# Patient Record
Sex: Female | Born: 1975 | Race: White | Hispanic: No | Marital: Single | Smoking: Never smoker
Health system: Southern US, Community
[De-identification: ages and names within clinical notes are randomized; demographics above are authoritative.]

---

## 2017-02-27 ENCOUNTER — Ambulatory Visit (INDEPENDENT_AMBULATORY_CARE_PROVIDER_SITE_OTHER): Payer: Worker's Compensation | Admitting: Orthopaedic Surgery

## 2017-02-27 DIAGNOSIS — M79645 Pain in left finger(s): Secondary | ICD-10-CM

## 2017-02-27 MED ORDER — PREDNISONE 10 MG (21) PO TBPK: ORAL_TABLET | ORAL | 0 refills | Status: AC

## 2017-02-27 NOTE — Progress Notes (Signed)
Office Visit Note   Patient: Kaylee Mccoy           Date of Birth: May 09, 1975           MRN: [id number] Visit Date: 02/27/2017              Requested by: No referring provider defined for this encounter. PCP: Cheron SchaumannVelazquez, Gretchen Y., MD   Assessment & Plan: Visit Diagnoses:  1. Finger pain, left     Plan: I reviewed the MRI of her left ring finger which shows edema within the flexor tendon sheath.  There is no evidence of a tear.  Functionally speaking there is no concern of any deficits.  Sounds like she is essentially just been working through the pain during this time and she has not been able to let her finger rest.  Will give her work restrictions next 2 weeks and put her on a prednisone taper.  Recheck in 2 weeks.  We may consider injection if she is not significantly better.  Follow-Up Instructions: Return in about 2 weeks (around 03/13/2017).   Orders:  No orders of the defined types were placed in this encounter.  Meds ordered this encounter  Medications  . predniSONE (STERAPRED UNI-PAK 21 TAB) 10 MG (21) TBPK tablet    Sig: Take as directed    Dispense:  21 tablet    Refill:  0      Procedures: No procedures performed   Clinical Data: No additional findings.   Subjective: Chief Complaint  Patient presents with  . 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  Her x-rays have been normal.  She also had an MRI which show some edema within the flexor tendons.  Denies any numbness and tingling.  Denies any triggering.    Review of Systems  Constitutional: Negative.   HENT: Negative.   Eyes: Negative.   Respiratory: Negative.   Cardiovascular: Negative.   Endocrine: Negative.   Musculoskeletal: Negative.   Neurological: Negative.   Hematological: Negative.   Psychiatric/Behavioral: Negative.   All  other systems reviewed and are negative.    Objective: Vital Signs: There were no vitals taken for this visit.  Physical Exam  Constitutional: She is oriented to person, place, and time. She appears well-developed and well-nourished.  HENT:  Head: Normocephalic and atraumatic.  Eyes: EOM are normal.  Neck: Neck supple.  Pulmonary/Chest: Effort normal.  Abdominal: Soft.  Neurological: She is alert and oriented to person, place, and time.  Skin: Skin is warm. Capillary refill takes less than 2 seconds.  Psychiatric: She has a normal mood and affect. Her behavior is normal. Judgment and thought content normal.  Nursing note and vitals reviewed.   Ortho Exam Left ring finger exam shows normally functioning flexor and extensor tendons.  There is no triggering.  She does have tenderness along the flexor tendon sheath.  There is no significant swelling.  Normal capillary refill. Specialty Comments:  No specialty comments available.  Imaging: No results found.   PMFS History: There are no active problems to display for this patient.  No past medical history on file.  No family history on file.   Social History   Occupational History  . Not on file  Tobacco Use  . Smoking status: Not on file  Substance and Sexual  Activity  . Alcohol use: Not on file  . Drug use: Not on file  . Sexual activity: Not on file

## 2017-03-03 ENCOUNTER — Telehealth (INDEPENDENT_AMBULATORY_CARE_PROVIDER_SITE_OTHER): Payer: Self-pay

## 2017-03-03 NOTE — Telephone Encounter (Signed)
Faxed the 02/27/17 office note and work note to [facility] per her request

## 2017-03-05 ENCOUNTER — Telehealth (INDEPENDENT_AMBULATORY_CARE_PROVIDER_SITE_OTHER): Payer: Self-pay

## 2017-03-05 ENCOUNTER — Other Ambulatory Visit (INDEPENDENT_AMBULATORY_CARE_PROVIDER_SITE_OTHER): Payer: Self-pay

## 2017-03-05 NOTE — Telephone Encounter (Signed)
Physical therapy called and needs an order for left hand ring finger therapy. I faxed this and then also faxed last office dictation to [phone]

## 2017-03-10 ENCOUNTER — Telehealth (INDEPENDENT_AMBULATORY_CARE_PROVIDER_SITE_OTHER): Payer: Self-pay | Admitting: Orthopaedic Surgery

## 2017-03-10 NOTE — Telephone Encounter (Signed)
Patient took her last Prednisone this morning. She has body aches and believes she may be catching a cold. She is worried about drug interaction and wanted to see if it would be okay to take benadryl? Also took her birth control and Murelax. CB #  [phone]

## 2017-03-10 NOTE — Telephone Encounter (Signed)
Wait until she's over her cold before starting the prednisone.  Benadryl is fine to take.

## 2017-03-10 NOTE — Telephone Encounter (Signed)
See message below °

## 2017-03-11 ENCOUNTER — Encounter (INDEPENDENT_AMBULATORY_CARE_PROVIDER_SITE_OTHER): Payer: Self-pay | Admitting: Orthopaedic Surgery

## 2017-03-11 ENCOUNTER — Ambulatory Visit (INDEPENDENT_AMBULATORY_CARE_PROVIDER_SITE_OTHER): Payer: Worker's Compensation | Admitting: Orthopaedic Surgery

## 2017-03-11 DIAGNOSIS — M79645 Pain in left finger(s): Secondary | ICD-10-CM

## 2017-03-11 NOTE — Progress Notes (Signed)
   Office Visit Note   Patient: Kaylee Mccoy           Date of Birth: 03-Nov-1975           MRN: [id number] Visit Date: 03/11/2017              Requested by: Cheron SchaumannVelazquez, Gretchen Y., MD [address] [place], [place] [id number] PCP: Cheron SchaumannVelazquez, Gretchen Y., MD   Assessment & Plan: Visit Diagnoses:  1. Finger pain, left     Plan: Impression is left hand ring finger flexor tendon strain.  Kaylee Mccoy was given a hand therapy prescription at her last visit and she is scheduled to start this coming Friday.  I am also calling in a prescription for Duexis.  She was provided with samples today.  She will follow-up with us in 6 weeks time for recheck.  Follow-Up Instructions: Return in about 6 weeks (around 04/22/2017).   Orders:  No orders of the defined types were placed in this encounter.  No orders of the defined types were placed in this encounter.     Procedures: No procedures performed   Clinical Data: No additional findings.   Subjective: Chief Complaint  Patient presents with  . Left Hand - Follow-up    Left 4th finger tingling.    HPI Kaylee Mccoy comes in for follow-up.  Nearly 10 weeks out from left hand ring finger injury.  This did occur at [employer] and is followed under Worker's Comp.  We have been treating her for a flexor tendon strain based on MRI.   we initially wrote her a work note but she has had to continue with her regular duties as they did not have any accommodations.  Her pain is improving but she still notes tingling to the entire finger.  She finished a steroid taper yesterday.  This did help while she was on the steroids.  Review of Systems as detailed in HPI.  All others reviewed and are negative.   Objective: Vital Signs: There were no vitals taken for this visit.  Physical Exam well-developed well-nourished female no acute distress.  Alert and oriented x3.  Ortho Exam examination of her left hand ring finger reveals full flexion and extension.   Minimal tenderness throughout.  Specialty Comments:  No specialty comments available.  Imaging: No new imaging today   PMFS History: Patient Active Problem List   Diagnosis Date Noted  . Finger pain, left 03/11/2017   History reviewed. No pertinent past medical history.  History reviewed. No pertinent family history.  History reviewed. No pertinent surgical history. Social History   Occupational History  . Not on file  Tobacco Use  . Smoking status: Not on file  Substance and Sexual Activity  . Alcohol use: Not on file  . Drug use: Not on file  . Sexual activity: Not on file

## 2017-03-12 NOTE — Telephone Encounter (Signed)
Called patient to advise  °

## 2017-03-13 ENCOUNTER — Ambulatory Visit (INDEPENDENT_AMBULATORY_CARE_PROVIDER_SITE_OTHER): Payer: Worker's Compensation | Admitting: Orthopaedic Surgery

## 2017-03-20 ENCOUNTER — Telehealth (INDEPENDENT_AMBULATORY_CARE_PROVIDER_SITE_OTHER): Payer: Self-pay | Admitting: Orthopaedic Surgery

## 2017-03-20 NOTE — Telephone Encounter (Signed)
Patient is wondering if Dr. Roda ShuttersXu can prescribe her something for pain, she said her pain level has stayed about a 5 since the injury and has went up to about a 9 to wear she is crying. She was given a sample of a med (doesn't know the name, she said it started with a B) and was supposed to have been called into the pharmacy but was never done. She is also asking for some kind of hand immobilizer while she is at work, she does have a splint but she still needs to move it some. She seems kind of confused about what she needs to do to manage working with her hand if you could advise # [phone]

## 2017-03-20 NOTE — Telephone Encounter (Signed)
See message below. Please advise.   Also per last note you said "I am also calling in a prescription for Duexis.  She was provided with samples today."

## 2017-03-20 NOTE — Telephone Encounter (Signed)
Duexis 800 mg TID prn #30

## 2017-03-21 MED ORDER — IBUPROFEN-FAMOTIDINE 800-26.6 MG PO TABS: ORAL_TABLET | ORAL | 0 refills | Status: AC

## 2017-03-21 NOTE — Telephone Encounter (Signed)
Patient [facility] a different [facility], can you advise her of something else OTC? # [phone]

## 2017-03-21 NOTE — Telephone Encounter (Signed)
rx submitted to pharmacy on file for patient. LM for her advising this was done.

## 2017-03-21 NOTE — Telephone Encounter (Signed)
Patient said this was unable to be filled due to it being a combination of medications - not authorized through workers comp, Teacher, early years/prepharmacist had advised maybe trying to send them in separately. Patient also would like an answer about a sling, hand immobilizer, etc something that she can work in without having too much movement in her hand. Pain is radiating to her palm and has increased. Please advise her as soon as possible # [phone]

## 2017-03-24 NOTE — Telephone Encounter (Signed)
Ibuprofen 800 mg Pepcid ac 20 mg I don't know of a specific brace that would help her without it creating stiffness and weakness.  She really needs to rest and not use the hand.

## 2017-03-24 NOTE — Telephone Encounter (Signed)
See message below °

## 2017-03-25 ENCOUNTER — Telehealth (INDEPENDENT_AMBULATORY_CARE_PROVIDER_SITE_OTHER): Payer: Self-pay | Admitting: Orthopaedic Surgery

## 2017-03-25 NOTE — Telephone Encounter (Signed)
02[facility]27/2017 [facility] [phone]

## 2017-03-26 ENCOUNTER — Other Ambulatory Visit (INDEPENDENT_AMBULATORY_CARE_PROVIDER_SITE_OTHER): Payer: Self-pay

## 2017-03-26 NOTE — Telephone Encounter (Signed)
Called patient to advise. No answer. LMOM.  

## 2017-04-21 ENCOUNTER — Ambulatory Visit (INDEPENDENT_AMBULATORY_CARE_PROVIDER_SITE_OTHER): Payer: Worker's Compensation | Admitting: Orthopaedic Surgery

## 2017-04-21 ENCOUNTER — Encounter (INDEPENDENT_AMBULATORY_CARE_PROVIDER_SITE_OTHER): Payer: Self-pay | Admitting: Orthopaedic Surgery

## 2017-04-21 DIAGNOSIS — M79645 Pain in left finger(s): Secondary | ICD-10-CM

## 2017-04-21 MED ORDER — BUPIVACAINE HCL 0.5 % IJ SOLN
0.3300 mL | INTRAMUSCULAR | Status: AC | PRN
  Administered 2017-04-21: .33 mL

## 2017-04-21 MED ORDER — METHYLPREDNISOLONE ACETATE 40 MG/ML IJ SUSP
13.3300 mg | INTRAMUSCULAR | Status: AC | PRN
  Administered 2017-04-21: 13.33 mg

## 2017-04-21 MED ORDER — LIDOCAINE HCL 1 % IJ SOLN
0.3000 mL | INTRAMUSCULAR | Status: AC | PRN
  Administered 2017-04-21: .3 mL

## 2017-04-21 NOTE — Progress Notes (Signed)
   Office Visit Note   Patient: Kaylee Mccoy           Date of Birth: May 01, 1975           MRN: [id number] Visit Date: 04/21/2017              Requested by: Cheron SchaumannVelazquez, Gretchen Y., MD [address] [place], [place] [id number] PCP: Cheron SchaumannVelazquez, Gretchen Y., MD   Assessment & Plan: Visit Diagnoses:  1. Finger pain, left     Plan: Impression is continued left ring finger flexor tendon strain versus partial tear.  Today we performed a cortisone injection.  Patient tolerates well.  Work note was provided today.  Recheck in 6 weeks.  Follow-Up Instructions: Return in about 6 weeks (around 06/02/2017).   Orders:  No orders of the defined types were placed in this encounter.  No orders of the defined types were placed in this encounter.     Procedures: Hand/UE Inj for (Flexor tendon edema) on 04/21/2017 9:38 AM Indications: pain Details: 25 G needle Medications: 0.3 mL lidocaine 1 %; 0.33 mL bupivacaine 0.5 %; 13.33 mg methylPREDNISolone acetate 40 MG/ML Outcome: tolerated well, no immediate complications Consent was given by the patient. Patient was prepped and draped in the usual sterile fashion.       Clinical Data: No additional findings.   Subjective: Chief Complaint  Patient presents with  . Follow-up    Patient follows up today for her left hand pain.  She states that she is not any better.  Admittedly she is still doing full duty with repetitive use of her left hand.   Review of Systems  Constitutional: Negative.   HENT: Negative.   Eyes: Negative.   Respiratory: Negative.   Cardiovascular: Negative.   Endocrine: Negative.   Musculoskeletal: Negative.   Neurological: Negative.   Hematological: Negative.   Psychiatric/Behavioral: Negative.   All other systems reviewed and are negative.    Objective: Vital Signs: There were no vitals taken for this visit.  Physical Exam  Constitutional: She is oriented to person, place, and time. She appears well-developed  and well-nourished.  Pulmonary/Chest: Effort normal.  Neurological: She is alert and oriented to person, place, and time.  Skin: Skin is warm. Capillary refill takes less than 2 seconds.  Psychiatric: She has a normal mood and affect. Her behavior is normal. Judgment and thought content normal.  Nursing note and vitals reviewed.   Ortho Exam Left hand exam shows tenderness along the flexor tendon in the fourth metacarpal region.  There is no triggering.  There is no extensor or flexor deficits. Specialty Comments:  No specialty comments available.  Imaging: No results found.   PMFS History: Patient Active Problem List   Diagnosis Date Noted  . Finger pain, left 03/11/2017   History reviewed. No pertinent past medical history.  History reviewed. No pertinent family history.  History reviewed. No pertinent surgical history. Social History   Occupational History  . Not on file  Tobacco Use  . Smoking status: Not on file  Substance and Sexual Activity  . Alcohol use: Not on file  . Drug use: Not on file  . Sexual activity: Not on file

## 2017-04-28 ENCOUNTER — Telehealth (INDEPENDENT_AMBULATORY_CARE_PROVIDER_SITE_OTHER): Payer: Self-pay

## 2017-04-28 NOTE — Telephone Encounter (Signed)
F[facility] out [facility] [employer]  [employer] [facility] form today and faxed to them to fax number [phone].   Sent original form to scan.

## 2017-05-19 ENCOUNTER — Telehealth (INDEPENDENT_AMBULATORY_CARE_PROVIDER_SITE_OTHER): Payer: Self-pay | Admitting: Orthopaedic Surgery

## 2017-05-19 NOTE — Telephone Encounter (Signed)
Please advise. OK for note?  

## 2017-05-19 NOTE — Telephone Encounter (Signed)
Patient called asked if it should be in writing that she needed to keep her splint on. Patient said her employer may need this in writing. The number to contact patient is [phone]

## 2017-05-19 NOTE — Telephone Encounter (Signed)
yes

## 2017-05-19 NOTE — Telephone Encounter (Signed)
Note written. I left voicemail for patient advising.

## 2017-06-02 ENCOUNTER — Ambulatory Visit (INDEPENDENT_AMBULATORY_CARE_PROVIDER_SITE_OTHER): Payer: Worker's Compensation | Admitting: Orthopaedic Surgery

## 2017-06-03 ENCOUNTER — Encounter (INDEPENDENT_AMBULATORY_CARE_PROVIDER_SITE_OTHER): Payer: Self-pay | Admitting: Orthopaedic Surgery

## 2017-06-03 ENCOUNTER — Encounter (INDEPENDENT_AMBULATORY_CARE_PROVIDER_SITE_OTHER): Payer: Self-pay

## 2017-06-03 ENCOUNTER — Ambulatory Visit (INDEPENDENT_AMBULATORY_CARE_PROVIDER_SITE_OTHER): Payer: Worker's Compensation | Admitting: Orthopaedic Surgery

## 2017-06-03 DIAGNOSIS — M79641 Pain in right hand: Secondary | ICD-10-CM | POA: Diagnosis not present

## 2017-06-03 NOTE — Progress Notes (Signed)
Patient comes in today approximately 5 months out from a left ring finger flexor tendon strain versus partial tear.  This did occur at [facility] where she is employed and is under [employer]  We initially obtained an MRI which showed nothing more than a edema within the flexor tendon sheath.  There is never been any functional deficits.  We have tried oral anti-inflammatories, cortisone injection, splints as well as physical therapy all without relief of symptoms.  At this point, we will refer the patient to a hand specialist.  She will follow-up with [place] as needed.  In the meantime, we will extend her work note for another 2 weeks.  Call with concerns or questions in the meantime.

## 2017-06-30 ENCOUNTER — Telehealth (INDEPENDENT_AMBULATORY_CARE_PROVIDER_SITE_OTHER): Payer: Self-pay

## 2017-06-30 NOTE — Telephone Encounter (Signed)
[place], called her to let her know it was faxed.

## 2017-06-30 NOTE — Telephone Encounter (Signed)
Melinda with [facility] would like a copy of patient's last office note faxed to [phone].  Would like a CB once this has been faxed to their office.  Patient is at there office now.  Cb# is [phone].  Please advise.  Thank you.

## 2017-09-12 ENCOUNTER — Other Ambulatory Visit: Payer: Self-pay | Admitting: Orthopedic Surgery

## 2017-09-12 DIAGNOSIS — M79645 Pain in left finger(s): Secondary | ICD-10-CM

## 2017-09-12 DIAGNOSIS — S66115A Strain of flexor muscle, fascia and tendon of left ring finger at wrist and hand level, initial encounter: Secondary | ICD-10-CM

## 2017-09-16 ENCOUNTER — Ambulatory Visit
Admission: RE | Admit: 2017-09-16 | Discharge: 2017-09-16 | Disposition: A | Discharge status: Home / Self Care | Payer: Worker's Compensation | Source: Ambulatory Visit | Attending: Orthopedic Surgery | Admitting: Orthopedic Surgery

## 2017-09-16 DIAGNOSIS — S66115A Strain of flexor muscle, fascia and tendon of left ring finger at wrist and hand level, initial encounter: Secondary | ICD-10-CM

## 2017-09-16 DIAGNOSIS — M79645 Pain in left finger(s): Secondary | ICD-10-CM

## 2019-07-18 IMAGING — US US EXTREM UP*L* COMP
1 series · 14 of 23 positions shown · non-contrast
Comparison: None.

CLINICAL DATA: Left hand pain and left ring finger pain.

EXAM:
ULTRASOUND LEFT UPPER EXTREMITY COMPLETE
TECHNIQUE: Ultrasound examination was performed including evaluation of the
muscles, tendons, joint, and adjacent soft tissues.

[Series 1: us extrem up*left* comp · 0.06mm/px · 23 acquisitions, 14 frames shown]
[im 1/23]
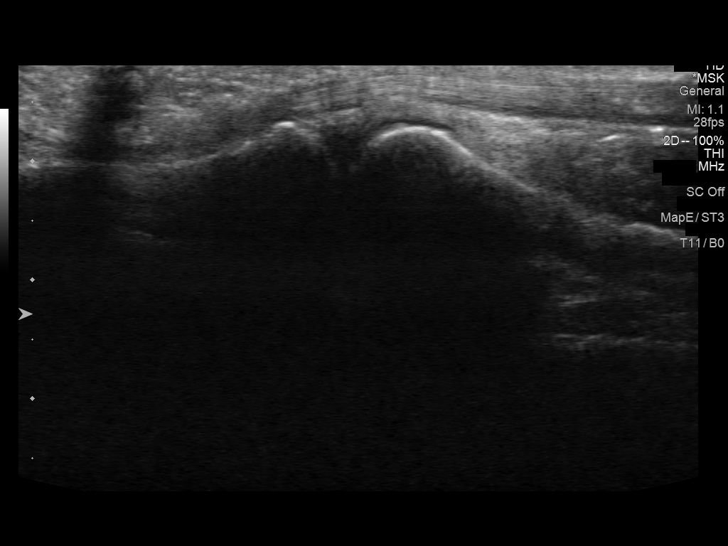
[im 3/23]
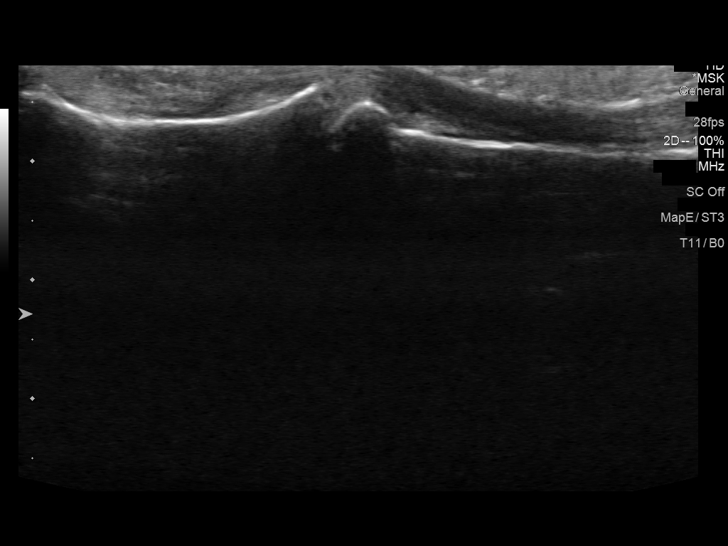
[im 5/23]
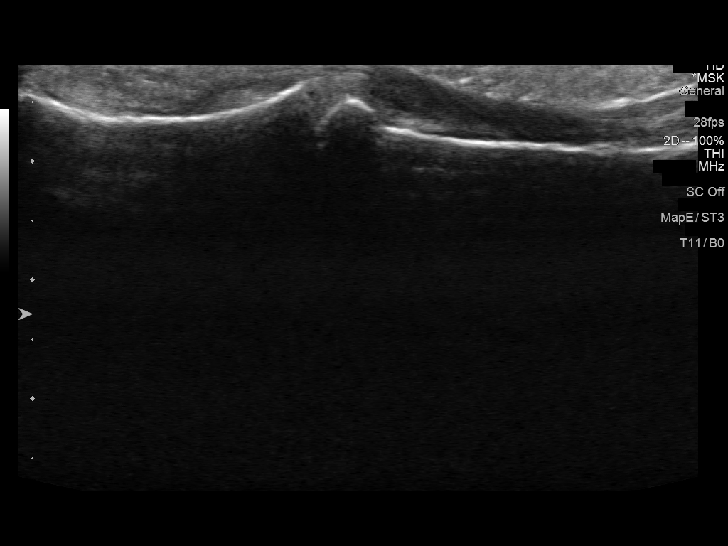
[im 6/23]
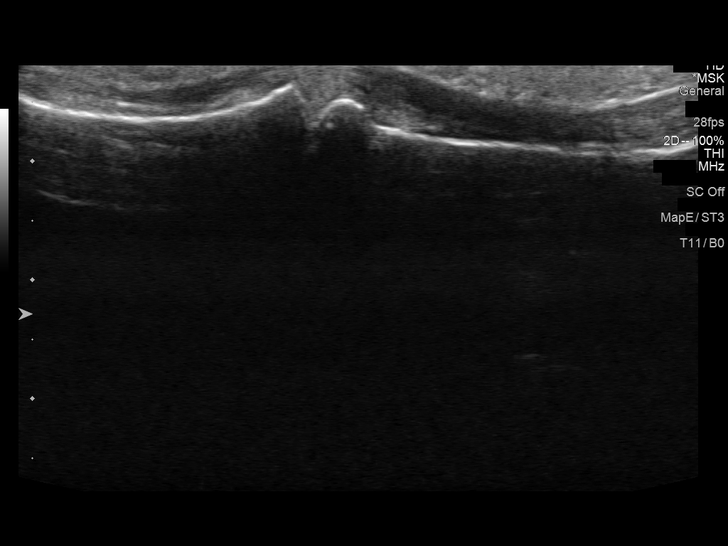
[im 8/23]
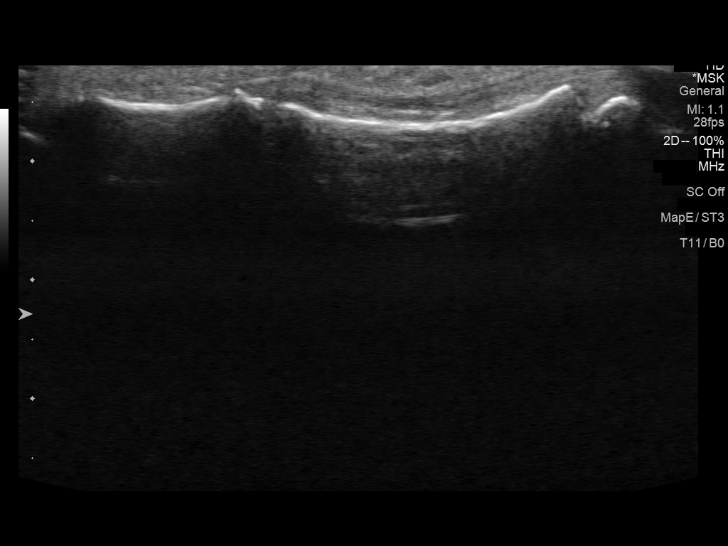
[im 10/23]
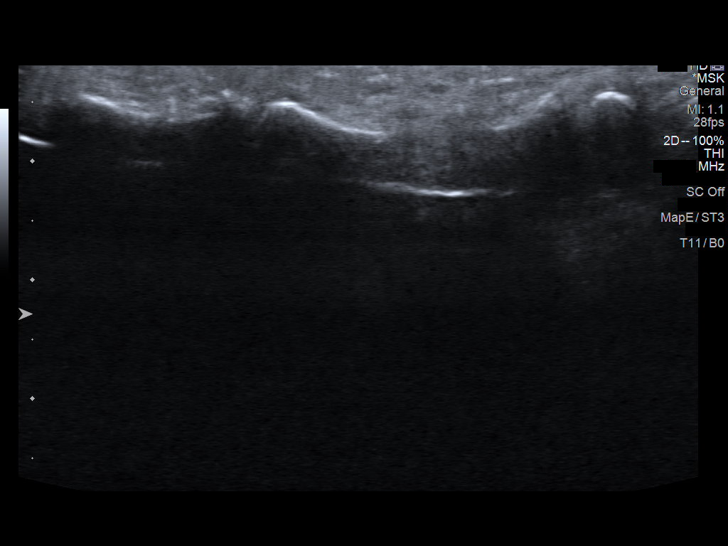
[im 11/23]
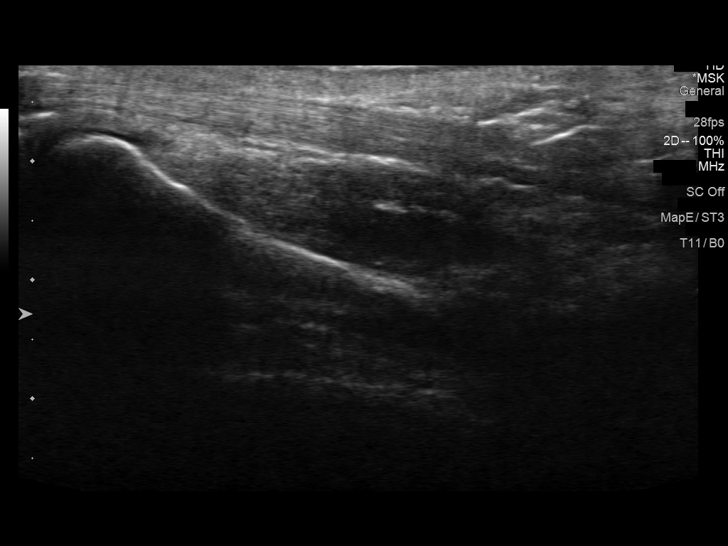
[im 13/23]
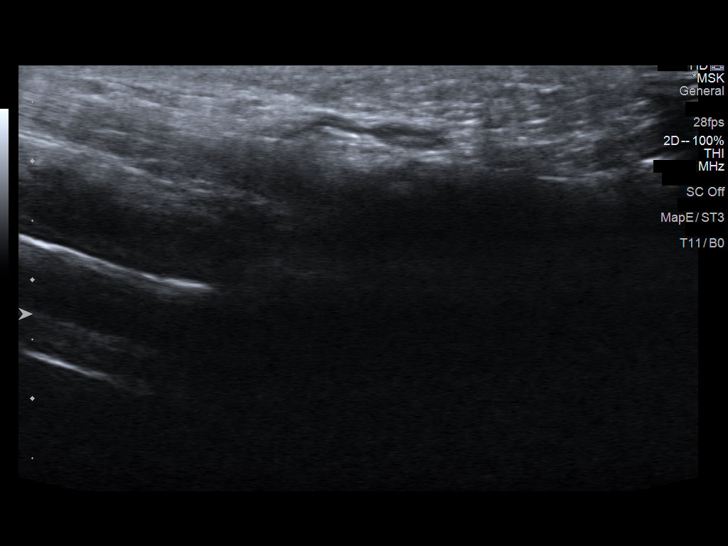
[im 14/23]
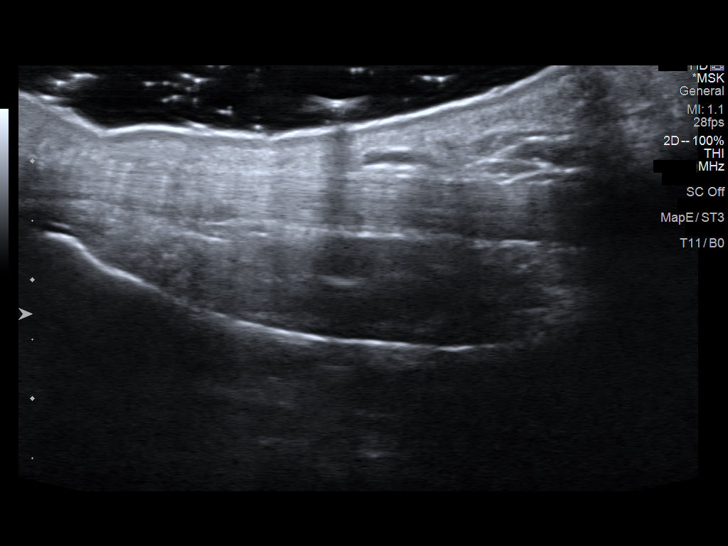
[im 16/23]
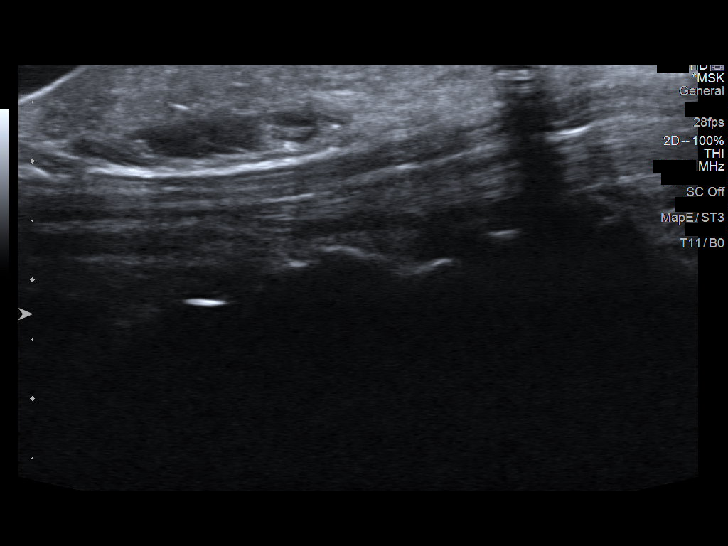
[im 18/23]
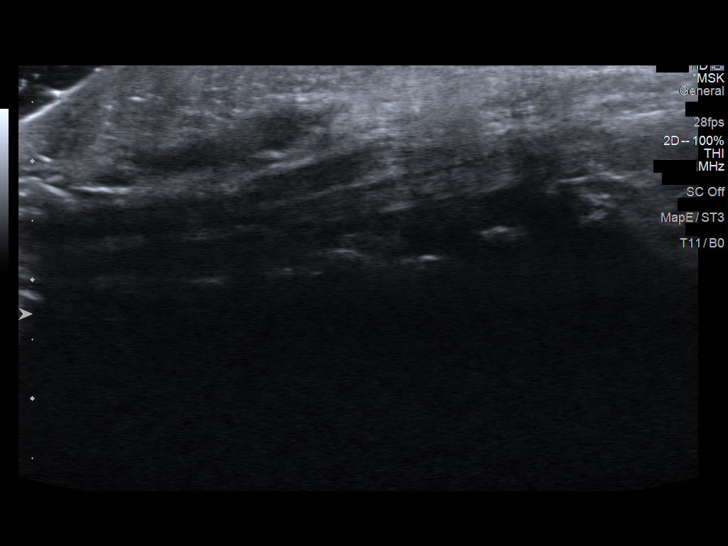
[im 19/23]
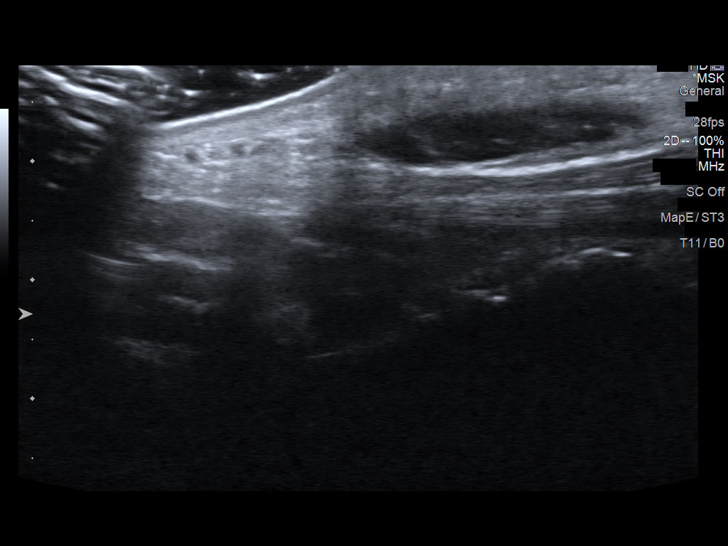
[im 21/23]
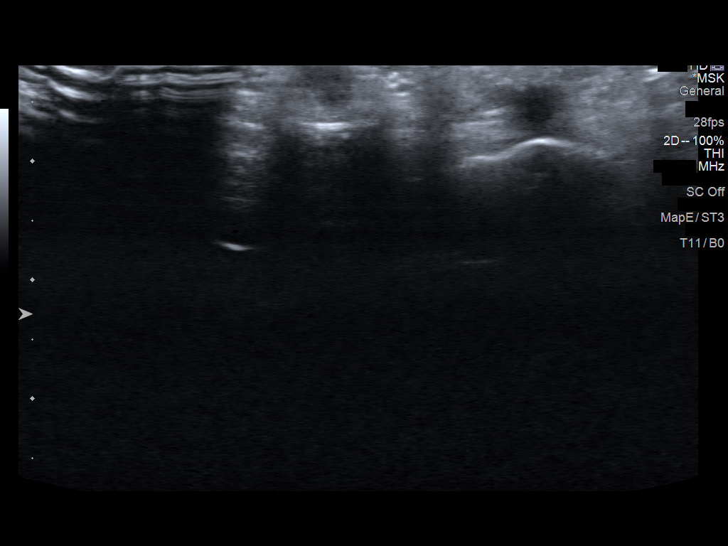
[im 23/23]
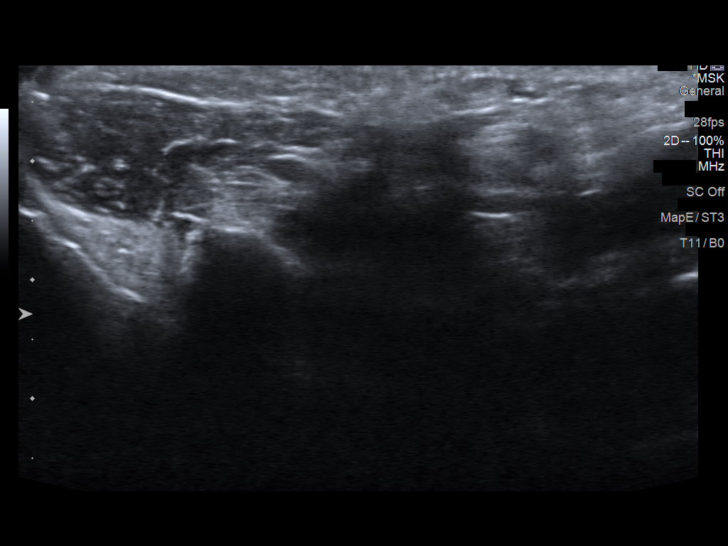

[14 of 23 positions shown; findings below may reference images not displayed]

FINDINGS: I performed realtime ultrasound using the 18 megahertz transducer. I
examined the IP joints and MCP joint of the left ring finger as well
as flexor tendons of the ring finger from proximal to the
radiocarpal joint through the carpal tunnel to the DIP joint.

The flexor tendons appear normal throughout their course. Video
clips demonstrate normal motion of the flexor tendons.

There are no joint effusions or mass lesions.  No ganglion cysts.
IMPRESSION: Normal ultrasound examination of the flexor apparatus of the left
ring finger.
